# Patient Record
Sex: Male | Born: 1965
Health system: Southern US, Community
[De-identification: ages and names within clinical notes are randomized; demographics above are authoritative.]

## PROBLEM LIST (undated history)

## (undated) DIAGNOSIS — H4710 Unspecified papilledema: Secondary | ICD-10-CM

## (undated) DIAGNOSIS — H02409 Unspecified ptosis of unspecified eyelid: Secondary | ICD-10-CM

## (undated) DIAGNOSIS — H532 Diplopia: Secondary | ICD-10-CM

## (undated) DIAGNOSIS — E7800 Pure hypercholesterolemia, unspecified: Secondary | ICD-10-CM

## (undated) DIAGNOSIS — I10 Essential (primary) hypertension: Secondary | ICD-10-CM

## (undated) HISTORY — DX: Diplopia: H53.2

## (undated) HISTORY — DX: Essential (primary) hypertension: I10

## (undated) HISTORY — DX: Unspecified papilledema: H47.10

## (undated) HISTORY — DX: Unspecified ptosis of unspecified eyelid: H02.409

## (undated) HISTORY — DX: Pure hypercholesterolemia, unspecified: E78.00

---

## 2005-05-02 ENCOUNTER — Encounter: Admission: RE | Admit: 2005-05-02 | Discharge: 2005-05-02 | Payer: Self-pay | Admitting: Emergency Medicine

## 2009-01-16 ENCOUNTER — Encounter: Admission: RE | Admit: 2009-01-16 | Discharge: 2009-01-16 | Payer: Self-pay | Admitting: Family Medicine

## 2018-10-25 DIAGNOSIS — Z125 Encounter for screening for malignant neoplasm of prostate: Secondary | ICD-10-CM | POA: Diagnosis not present

## 2018-10-25 DIAGNOSIS — Z23 Encounter for immunization: Secondary | ICD-10-CM | POA: Diagnosis not present

## 2018-12-03 DIAGNOSIS — R112 Nausea with vomiting, unspecified: Secondary | ICD-10-CM | POA: Diagnosis not present

## 2018-12-03 DIAGNOSIS — A084 Viral intestinal infection, unspecified: Secondary | ICD-10-CM | POA: Diagnosis not present

## 2018-12-03 DIAGNOSIS — R51 Headache: Secondary | ICD-10-CM | POA: Diagnosis not present

## 2019-05-09 DIAGNOSIS — Z0000 Encounter for general adult medical examination without abnormal findings: Secondary | ICD-10-CM | POA: Diagnosis not present

## 2019-05-23 DIAGNOSIS — Z23 Encounter for immunization: Secondary | ICD-10-CM | POA: Diagnosis not present

## 2019-05-23 DIAGNOSIS — Z0000 Encounter for general adult medical examination without abnormal findings: Secondary | ICD-10-CM | POA: Diagnosis not present

## 2019-05-23 DIAGNOSIS — Z13220 Encounter for screening for lipoid disorders: Secondary | ICD-10-CM | POA: Diagnosis not present

## 2019-05-23 DIAGNOSIS — Z125 Encounter for screening for malignant neoplasm of prostate: Secondary | ICD-10-CM | POA: Diagnosis not present

## 2019-07-10 DIAGNOSIS — R42 Dizziness and giddiness: Secondary | ICD-10-CM | POA: Diagnosis not present

## 2019-07-22 DIAGNOSIS — H8110 Benign paroxysmal vertigo, unspecified ear: Secondary | ICD-10-CM | POA: Diagnosis not present

## 2019-07-22 DIAGNOSIS — H6121 Impacted cerumen, right ear: Secondary | ICD-10-CM | POA: Diagnosis not present

## 2019-07-22 DIAGNOSIS — Z79899 Other long term (current) drug therapy: Secondary | ICD-10-CM | POA: Diagnosis not present

## 2019-09-18 DIAGNOSIS — Z712 Person consulting for explanation of examination or test findings: Secondary | ICD-10-CM | POA: Diagnosis not present

## 2019-09-18 DIAGNOSIS — Z20828 Contact with and (suspected) exposure to other viral communicable diseases: Secondary | ICD-10-CM | POA: Diagnosis not present

## 2019-09-19 DIAGNOSIS — Z20828 Contact with and (suspected) exposure to other viral communicable diseases: Secondary | ICD-10-CM | POA: Diagnosis not present

## 2019-09-19 DIAGNOSIS — Z712 Person consulting for explanation of examination or test findings: Secondary | ICD-10-CM | POA: Diagnosis not present

## 2019-12-26 ENCOUNTER — Ambulatory Visit: Payer: Self-pay | Attending: Internal Medicine

## 2019-12-26 DIAGNOSIS — Z23 Encounter for immunization: Secondary | ICD-10-CM

## 2019-12-26 NOTE — Progress Notes (Signed)
   Covid-19 Vaccination Clinic  Name:  Keshone Yetter    MRN: [id number] DOB: [birth year]  12/26/2019  Mr. Jons was observed post Covid-19 immunization for 15 minutes without incident. He was provided with Vaccine Information Sheet and instruction to access the V-Safe system.   Mr. Arrant was instructed to call 911 with any severe reactions post vaccine: Marland Kitchen Difficulty breathing  . Swelling of face and throat  . A fast heartbeat  . A bad rash all over body  . Dizziness and weakness   Immunizations Administered    Name Date Dose VIS Date Route   Pfizer COVID-19 Vaccine 12/26/2019 12:03 PM 0.3 mL 09/20/2019 Intramuscular   Manufacturer: [place]   Lot: [id number]   [place]: [id number]

## 2020-01-20 ENCOUNTER — Ambulatory Visit: Payer: Self-pay | Attending: Internal Medicine

## 2020-01-20 DIAGNOSIS — Z23 Encounter for immunization: Secondary | ICD-10-CM

## 2020-01-20 NOTE — Progress Notes (Signed)
   Covid-19 Vaccination Clinic  Name:  Coleston Sarff    MRN: [id number] DOB: Mar 03, 1966  01/20/2020  Mr. Desilva was observed post Covid-19 immunization for 15 minutes without incident. He was provided with Vaccine Information Sheet and instruction to access the V-Safe system.   Mr. Goben was instructed to call 911 with any severe reactions post vaccine: Marland Kitchen Difficulty breathing  . Swelling of face and throat  . A fast heartbeat  . A bad rash all over body  . Dizziness and weakness   Immunizations Administered    Name Date Dose VIS Date Route   Pfizer COVID-19 Vaccine 01/20/2020 10:47 AM 0.3 mL 09/20/2019 Intramuscular   Manufacturer: [place]   Lot: [id number]   [place]: [id number]

## 2020-04-06 DIAGNOSIS — H4710 Unspecified papilledema: Secondary | ICD-10-CM | POA: Diagnosis not present

## 2020-04-14 ENCOUNTER — Encounter: Payer: Self-pay | Admitting: *Deleted

## 2020-04-15 ENCOUNTER — Ambulatory Visit
Admission: RE | Admit: 2020-04-15 | Discharge: 2020-04-15 | Disposition: A | Discharge status: Home / Self Care | Payer: BC Managed Care – PPO | Source: Ambulatory Visit | Attending: Diagnostic Neuroimaging | Admitting: Diagnostic Neuroimaging

## 2020-04-15 ENCOUNTER — Telehealth: Payer: Self-pay | Admitting: Diagnostic Neuroimaging

## 2020-04-15 ENCOUNTER — Ambulatory Visit (INDEPENDENT_AMBULATORY_CARE_PROVIDER_SITE_OTHER): Payer: BC Managed Care – PPO | Admitting: Diagnostic Neuroimaging

## 2020-04-15 ENCOUNTER — Encounter: Payer: Self-pay | Admitting: Diagnostic Neuroimaging

## 2020-04-15 VITALS — BP 146/88 | HR 85 | Ht 76.0 in | Wt 217.0 lb

## 2020-04-15 DIAGNOSIS — H4710 Unspecified papilledema: Secondary | ICD-10-CM

## 2020-04-15 DIAGNOSIS — H0520 Unspecified exophthalmos: Secondary | ICD-10-CM

## 2020-04-15 DIAGNOSIS — Q158 Other specified congenital malformations of eye: Secondary | ICD-10-CM

## 2020-04-15 DIAGNOSIS — D1809 Hemangioma of other sites: Secondary | ICD-10-CM

## 2020-04-15 DIAGNOSIS — H4711 Papilledema associated with increased intracranial pressure: Secondary | ICD-10-CM | POA: Diagnosis not present

## 2020-04-15 DIAGNOSIS — H532 Diplopia: Secondary | ICD-10-CM | POA: Diagnosis not present

## 2020-04-15 MED ORDER — GADOBENATE DIMEGLUMINE 529 MG/ML IV SOLN
20.0000 mL | Freq: Once | INTRAVENOUS | Status: AC | PRN
  Administered 2020-04-15: 20 mL via INTRAVENOUS

## 2020-04-15 NOTE — Progress Notes (Signed)
[facility]  PATIENT: Lucas Glenn DOB: 07-02-1966  REFERRING CLINICIAN: [place], Heather, OD HISTORY FROM: patient  REASON FOR VISIT: new consult    HISTORICAL  CHIEF COMPLAINT:  Chief Complaint  Patient presents with  . Proptosis, optic nerve edema, double vision    rm 7 New Pt "my eyes are differrent but they have been since my jaw surgery in 1988"    HISTORY OF PRESENT ILLNESS:   54 year old male here for evaluation of right proptosis, right papilledema, double vision.  In February 2021 patient's friend noticed that patient's right eye was starting to protrude outward.  No blurred vision or eye pain at that time.  Patient continues to stay active with running, bowling and golf.  His current bowling average is 238.  He has run more than 20 marathons.  He has also completed an Ironman triathlon.  Several weeks ago patient noticed redness and watering in his right eye.  He had some irritation.  Went to optometrist who noted right proptosis, and right optic nerve edema.  Patient was referred urgently to [place] for evaluation.  Patient has noticed some intermittent vertical double vision when driving in his car, especially late at night.  No problems with left eye.  No problems with speech or swallowing.  No problems with arms or legs.  No headaches.  Patient has history of oral surgery on his maxillary mandibular regions in 1988 to treat an underbite.   REVIEW OF SYSTEMS: Full 14 system review of systems performed and negative with exception of: As per HPI.  ALLERGIES: No Known Allergies  HOME MEDICATIONS: Outpatient Medications Prior to Visit  Medication Sig Dispense Refill  . atorvastatin (LIPITOR) 10 MG tablet Take 10 mg by mouth daily.    . Multiple Vitamin (MULTIVITAMIN WITH MINERALS) TABS tablet Take 1 tablet by mouth daily.    Marland Kitchen telmisartan (MICARDIS) 40 MG tablet Take by mouth.    [facility] ophthalmic solution Place 1 drop into the right eye 4 (four)  times daily.    Marland Kitchen UNABLE TO FIND Med Name: for joints     No facility-administered medications prior to visit.    PAST MEDICAL HISTORY: Past Medical History:  Diagnosis Date  . Double vision   . High cholesterol   . Hypertension   . Optic nerve edema   . Ptosis     PAST SURGICAL HISTORY: Past Surgical History:  Procedure Laterality Date  . COLONOSCOPY    . INGUINAL HERNIA REPAIR  1988  . INNER EAR SURGERY  1969   tubes  . MANDIBLE SURGERY  1988  . TONSILLECTOMY AND ADENOIDECTOMY     as child    FAMILY HISTORY: Family History  Problem Relation Age of Onset  . Other Mother        viral encephalitis    SOCIAL HISTORY: Social History   Socioeconomic History  . Marital status: Single    Spouse name: Not on file  . Number of children: 0  . Years of education: Not on file  . Highest education level: Master's degree (e.g., MA, MS, MEng, MEd, MSW, MBA)  Occupational History    Comment: [employer]  Tobacco Use  . Smoking status: Never Smoker  . Smokeless tobacco: Never Used  Substance and Sexual Activity  . Alcohol use: Yes    Comment: socially  . Drug use: Not Currently  . Sexual activity: Not on file  Other Topics Concern  . Not on file  Social History Narrative  Caffeine- tea ,Coke 2-3   Social Determinants of Health   Financial Resource Strain:   . Difficulty of Paying Living Expenses:   Food Insecurity:   . Worried About Charity fundraiser in the Last Year:   . Arboriculturist in the Last Year:   Transportation Needs:   . Film/video editor (Medical):   Marland Kitchen Lack of Transportation (Non-Medical):   Physical Activity:   . Days of Exercise per Week:   . Minutes of Exercise per Session:   Stress:   . Feeling of Stress :   Social Connections:   . Frequency of Communication with Friends and Family:   . Frequency of Social Gatherings with Friends and Family:   . Attends Religious Services:   . Active Member of Clubs or Organizations:   .  Attends Archivist Meetings:   Marland Kitchen Marital Status:   Intimate Partner Violence:   . Fear of Current or Ex-Partner:   . Emotionally Abused:   Marland Kitchen Physically Abused:   . Sexually Abused:      PHYSICAL EXAM  GENERAL EXAM/CONSTITUTIONAL: Vitals:  Vitals:   04/15/20 1243  BP: (!) 146/88  Pulse: 85  Weight: 217 lb (98.4 kg)  Height: 6\' 4"  (1.93 m)     Body mass index is 26.41 kg/m. Wt Readings from Last 3 Encounters:  04/15/20 217 lb (98.4 kg)     Patient is in no distress; well developed, nourished and groomed; neck is supple  CARDIOVASCULAR:  Examination of carotid arteries is normal; no carotid bruits  Regular rate and rhythm, no murmurs  Examination of peripheral vascular system by observation and palpation is normal  EYES:  RIGHT PROPTOSIS; Ophthalmoscopic exam of optic discs and posterior segments is NOTABLE FOR RIGHT OPTIC NERVE EDEMA No exam data present  MUSCULOSKELETAL:  Gait, strength, tone, movements noted in Neurologic exam below  NEUROLOGIC: MENTAL STATUS:  No flowsheet data found.  awake, alert, oriented to person, place and time  recent and remote memory intact  normal attention and concentration  language fluent, comprehension intact, naming intact  fund of knowledge appropriate  CRANIAL NERVE:   2nd - RIGHT OPTIC NERVE EDEMA  2nd, 3rd, 4th, 6th - pupils equal and reactive to light, visual fields full to confrontation, extraocular muscles intact, no nystagmus  5th - facial sensation symmetric  7th - facial strength symmetric  8th - hearing intact  9th - palate elevates symmetrically, uvula midline  11th - shoulder shrug symmetric  12th - tongue protrusion midline  MOTOR:   normal bulk and tone, full strength in the BUE, BLE  SENSORY:   normal and symmetric to light touch, pinprick, temperature, vibration  COORDINATION:   finger-nose-finger, fine finger movements normal  REFLEXES:   deep tendon reflexes  present and symmetric  GAIT/STATION:   narrow based gait; able to walk on toes, heels and tandem; romberg is negative     DIAGNOSTIC DATA (LABS, IMAGING, TESTING) - I reviewed patient records, labs, notes, testing and imaging myself where available.  No results found for: WBC, HGB, HCT, MCV, PLT No results found for: NA, K, CL, CO2, GLUCOSE, BUN, CREATININE, CALCIUM, PROT, ALBUMIN, AST, ALT, ALKPHOS, BILITOT, GFRNONAA, GFRAA No results found for: CHOL, HDL, LDLCALC, LDLDIRECT, TRIG, CHOLHDL No results found for: HGBA1C No results found for: VITAMINB12 No results found for: TSH     ASSESSMENT AND PLAN  54 y.o. year old male here with gradual onset proptosis, papilledema and double vision, since February  2021 or earlier.  Will check urgent MRI brain and orbits to rule out retro-orbital mass.  Dx:  1. Proptosis   2. Papilledema of right eye   3. Double vision with both eyes open       PLAN:  DOUBLE VISION / RIGHT PROPTOSIS / RIGHT PAPILLEDEMA - check MRI brain / orbits - check TSH, CBC, CMP  Orders Placed This Encounter  Procedures  . MR BRAIN W WO CONTRAST  . MR ORBITS W WO CONTRAST  . CBC with Differential/Platelet  . Comprehensive metabolic panel  . TSH   Return pending test results, for pending if symptoms worsen or fail to improve.  I reviewed images, labs, notes, records myself. I summarized findings and reviewed with patient, for this high risk condition (suspected right eye mass) requiring high complexity decision making.     Penni Bombard, MD 04/13/517, 3:35 PM Certified in Neurology, Neurophysiology and Neuroimaging  [facility] [address], [place], [place] [address] [phone]   ADDENDUM (04/15/20 7:09 PM) - MRI brain and orbits were ordered and obtained on the same day.  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  Results reviewed with patient  by phone.  Will refer patient to orbital surgeon / ophthalmology for definitive evaluation and treatment.  Penni Bombard, MD 10/10/8865, 7:37 PM Certified in Neurology, Neurophysiology and Neuroimaging  [facility] [address], [place], [place] [address] [phone]

## 2020-04-15 NOTE — Telephone Encounter (Signed)
BCBS Auth: [id number] (exp. 04/15/20 to 10/11/20) patient is scheduled at GI for 04/15/20

## 2020-04-15 NOTE — Telephone Encounter (Signed)
I called patient with test results.  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  Will setup referral asap.  Orders Placed This Encounter  Procedures  . Ambulatory referral to Ophthalmology    Penni Bombard, MD 01/10/8886, 5:79 PM Certified in Neurology, Neurophysiology and Neuroimaging  [facility] [address] [place], [place] [address] [phone]

## 2020-04-16 ENCOUNTER — Telehealth: Payer: Self-pay | Admitting: *Deleted

## 2020-04-16 LAB — COMPREHENSIVE METABOLIC PANEL
ALT: 24 IU/L (ref 0–44)
AST: 21 IU/L (ref 0–40)
Albumin/Globulin Ratio: 1.8 (ref 1.2–2.2)
Albumin: 4.9 g/dL (ref 3.8–4.9)
Alkaline Phosphatase: 87 IU/L (ref 48–121)
BUN/Creatinine Ratio: 25 — ABNORMAL HIGH (ref 9–20)
BUN: 23 mg/dL (ref 6–24)
Bilirubin Total: 0.5 mg/dL (ref 0.0–1.2)
CO2: 23 mmol/L (ref 20–29)
Calcium: 9.7 mg/dL (ref 8.7–10.2)
Chloride: 98 mmol/L (ref 96–106)
Creatinine, Ser: 0.93 mg/dL (ref 0.76–1.27)
GFR calc Af Amer: 106 mL/min/{1.73_m2} (ref >59)
GFR calc non Af Amer: 92 mL/min/{1.73_m2} (ref >59)
Globulin, Total: 2.7 g/dL (ref 1.5–4.5)
Glucose: 96 mg/dL (ref 65–99)
Potassium: 4.5 mmol/L (ref 3.5–5.2)
Sodium: 137 mmol/L (ref 134–144)
Total Protein: 7.6 g/dL (ref 6.0–8.5)

## 2020-04-16 LAB — CBC WITH DIFFERENTIAL/PLATELET
Basophils Absolute: 0.1 10*3/uL (ref 0.0–0.2)
Basos: 1 %
EOS (ABSOLUTE): 0.2 10*3/uL (ref 0.0–0.4)
Eos: 3 %
Hematocrit: 48.9 % (ref 37.5–51.0)
Hemoglobin: 16.3 g/dL (ref 13.0–17.7)
Immature Grans (Abs): 0 10*3/uL (ref 0.0–0.1)
Immature Granulocytes: 0 %
Lymphocytes Absolute: 2.3 10*3/uL (ref 0.7–3.1)
Lymphs: 29 %
MCH: 29.4 pg (ref 26.6–33.0)
MCHC: 33.3 g/dL (ref 31.5–35.7)
MCV: 88 fL (ref 79–97)
Monocytes Absolute: 0.9 10*3/uL (ref 0.1–0.9)
Monocytes: 11 %
Neutrophils Absolute: 4.5 10*3/uL (ref 1.4–7.0)
Neutrophils: 56 %
Platelets: 305 10*3/uL (ref 150–450)
RBC: 5.54 x10E6/uL (ref 4.14–5.80)
RDW: 12.6 % (ref 11.6–15.4)
WBC: 8 10*3/uL (ref 3.4–10.8)

## 2020-04-16 LAB — TSH: TSH: 2.24 u[IU]/mL (ref 0.450–4.500)

## 2020-04-16 NOTE — Telephone Encounter (Signed)
Spoke with patient and informed him his labs are okay. Patient verbalized understanding, appreciation.

## 2020-04-16 NOTE — Telephone Encounter (Addendum)
Received call from [facility] re: MRI brain, orbits who stated the MD wanted to be sure Dr Leta Baptist has seen reports. I advised her Dr Leta Baptist made a note last night re: right orbital mass, will refer to ophthalmology for urgent evaluation. Malachy Mood verbalized understanding, appreciation. Called patient and LVM informing his Hinton Dyer is working on his referral to [facility] and to be expecting a  call from them. Also advised he come to office to pick up a disk of MRI images before he goes to [facility].  Left #.

## 2020-04-16 NOTE — Telephone Encounter (Signed)
Patient spoke with referral coordinator then call was sent to me. He stated he will be flying out of town tomorrow but had discussed that with Dr Leta Baptist last night. He will get disk form [facility] and expect a call from [place]. He verbalized understanding, appreciation.

## 2020-04-23 DIAGNOSIS — H468 Other optic neuritis: Secondary | ICD-10-CM | POA: Diagnosis not present

## 2020-04-23 DIAGNOSIS — H0589 Other disorders of orbit: Secondary | ICD-10-CM | POA: Diagnosis not present

## 2020-05-14 DIAGNOSIS — Z125 Encounter for screening for malignant neoplasm of prostate: Secondary | ICD-10-CM | POA: Diagnosis not present

## 2020-05-14 DIAGNOSIS — E7800 Pure hypercholesterolemia, unspecified: Secondary | ICD-10-CM | POA: Diagnosis not present

## 2020-05-14 DIAGNOSIS — Z0000 Encounter for general adult medical examination without abnormal findings: Secondary | ICD-10-CM | POA: Diagnosis not present

## 2020-05-14 DIAGNOSIS — I10 Essential (primary) hypertension: Secondary | ICD-10-CM | POA: Diagnosis not present

## 2020-05-14 DIAGNOSIS — H6123 Impacted cerumen, bilateral: Secondary | ICD-10-CM | POA: Diagnosis not present

## 2020-05-14 DIAGNOSIS — Z23 Encounter for immunization: Secondary | ICD-10-CM | POA: Diagnosis not present

## 2020-06-30 DIAGNOSIS — Z23 Encounter for immunization: Secondary | ICD-10-CM | POA: Diagnosis not present

## 2020-07-03 DIAGNOSIS — D4989 Neoplasm of unspecified behavior of other specified sites: Secondary | ICD-10-CM | POA: Diagnosis not present

## 2020-07-03 DIAGNOSIS — E785 Hyperlipidemia, unspecified: Secondary | ICD-10-CM | POA: Diagnosis not present

## 2020-07-03 DIAGNOSIS — I10 Essential (primary) hypertension: Secondary | ICD-10-CM | POA: Diagnosis not present

## 2020-07-03 DIAGNOSIS — Z79899 Other long term (current) drug therapy: Secondary | ICD-10-CM | POA: Diagnosis not present

## 2020-07-03 DIAGNOSIS — D1809 Hemangioma of other sites: Secondary | ICD-10-CM | POA: Diagnosis not present

## 2020-10-07 DIAGNOSIS — J988 Other specified respiratory disorders: Secondary | ICD-10-CM | POA: Diagnosis not present

## 2020-10-07 DIAGNOSIS — R0981 Nasal congestion: Secondary | ICD-10-CM | POA: Diagnosis not present

## 2020-10-07 DIAGNOSIS — Z03818 Encounter for observation for suspected exposure to other biological agents ruled out: Secondary | ICD-10-CM | POA: Diagnosis not present

## 2020-11-27 DIAGNOSIS — I10 Essential (primary) hypertension: Secondary | ICD-10-CM | POA: Diagnosis not present

## 2020-12-09 DIAGNOSIS — D3161 Benign neoplasm of unspecified site of right orbit: Secondary | ICD-10-CM | POA: Diagnosis not present

## 2021-06-02 DIAGNOSIS — Z0000 Encounter for general adult medical examination without abnormal findings: Secondary | ICD-10-CM | POA: Diagnosis not present

## 2021-06-02 DIAGNOSIS — Z125 Encounter for screening for malignant neoplasm of prostate: Secondary | ICD-10-CM | POA: Diagnosis not present

## 2021-06-02 DIAGNOSIS — Z131 Encounter for screening for diabetes mellitus: Secondary | ICD-10-CM | POA: Diagnosis not present

## 2021-06-02 DIAGNOSIS — I10 Essential (primary) hypertension: Secondary | ICD-10-CM | POA: Diagnosis not present

## 2021-06-02 DIAGNOSIS — E7800 Pure hypercholesterolemia, unspecified: Secondary | ICD-10-CM | POA: Diagnosis not present

## 2021-08-24 IMAGING — MR MR HEAD WO/W CM
13 of 16 series · 35 of 48 positions shown · IV contrast (multihance)
Comparison: No pertinent prior studies available for comparison.

CLINICAL DATA: Proptosis. Papilledema of right eye. Double vision
with both eyes open. Additional history provided by scanning
technologist: Patient reports right eye bulging forward for many
years, recently visited new eye doctor

EXAM:
MRI HEAD AND ORBITS WITHOUT AND WITH CONTRAST
TECHNIQUE: Multiplanar, multiecho pulse sequences of the brain and surrounding
structures were obtained without and with intravenous contrast.
Multiplanar, multiecho pulse sequences of the orbits and surrounding
structures were obtained including fat saturation techniques, before
and after intravenous contrast administration.
CONTRAST:  20mL MULTIHANCE GADOBENATE DIMEGLUMINE 529 MG/ML IV SOLN

[Series 2: t1_se_sag · sagittal · 5.0mm · 0.45mm/px · 1 of 21 slices shown]
[im 1/21]
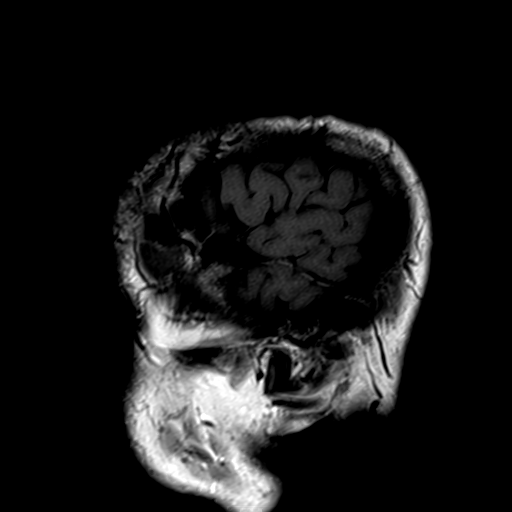

[Series 3: t2_tse_tra_512 · axial · 5.0mm · 0.62mm/px · 1 of 26 slices shown]
[im 1/26]
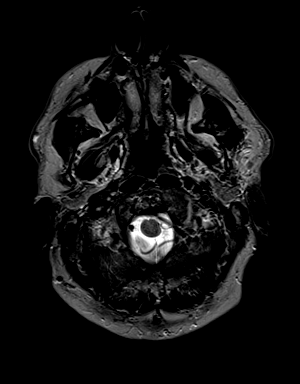

[Series 4: ep2d_diff_3 · axial · 3.0mm · 1.88mm/px · z∈[-56,+84]mm · 6 of 96 slices shown]
[im 1/96]
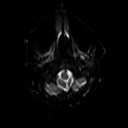
[im 20/96]
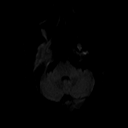
[im 39/96]
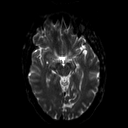
[im 58/96]
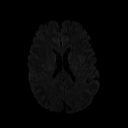
[im 77/96]
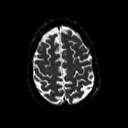
[im 96/96]
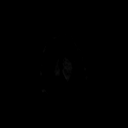

[Series 5: ep2d_diff_3_adc · axial · 3.0mm · 1.88mm/px · z∈[-56,+84]mm · 3 of 47 slices shown]
[im 1/47]
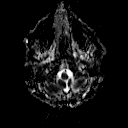
[im 24/47]
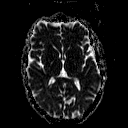
[im 47/47]
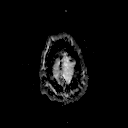

[Series 7: swi_images · axial · 2.0mm · 0.94mm/px · z∈[-57,+85]mm · 4 of 72 slices shown]
[im 1/72]
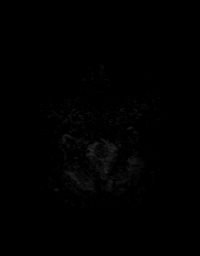
[im 24/72]
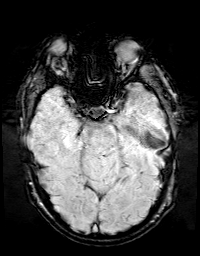
[im 48/72]
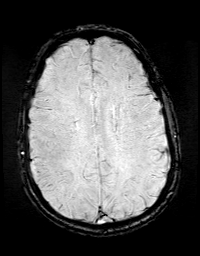
[im 72/72]
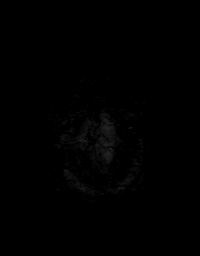

[Series 8: FLAIR · axial · 3.0mm · 0.47mm/px · z∈[-53,+81]mm · 2 of 30 slices shown]
[im 1/30]
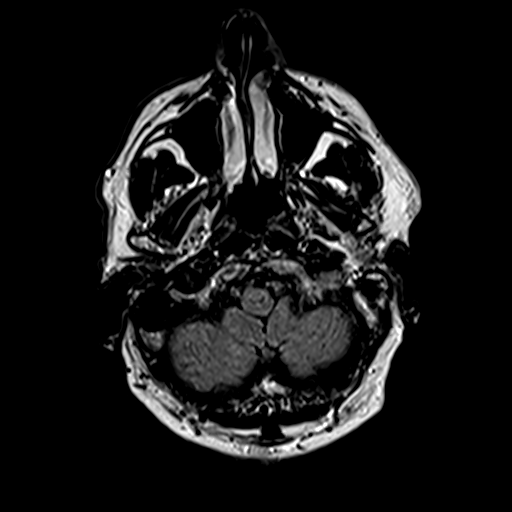
[im 30/30]
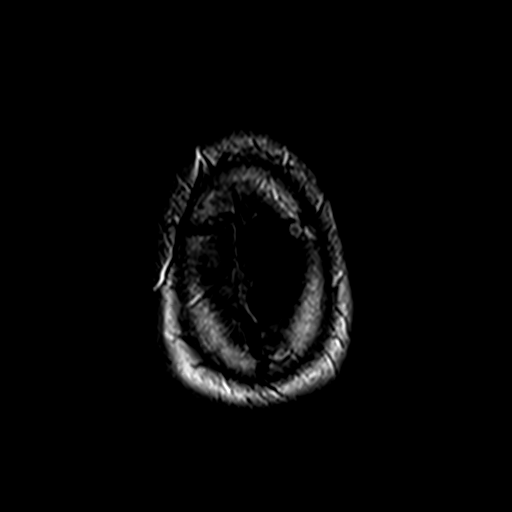

[Series 9: t1_mpr_tra · axial · 1.0mm · 0.75mm/px · z∈[-57,+86]mm · 8 of 144 slices shown]
[im 1/144]
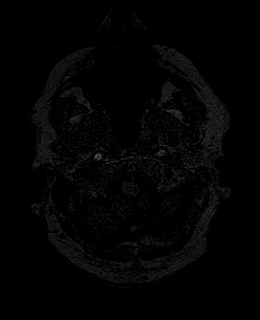
[im 18/144]
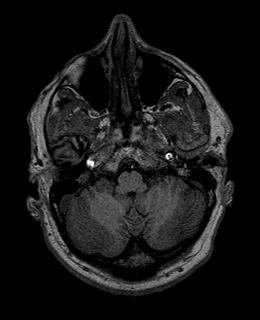
[im 36/144]
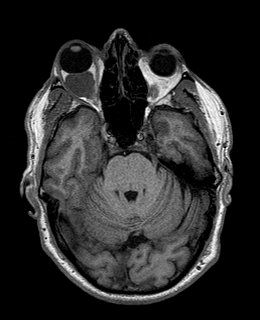
[im 54/144]
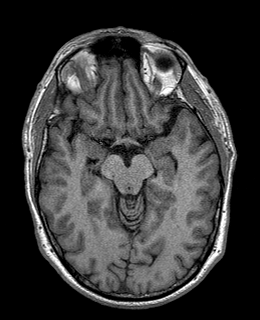
[im 90/144]
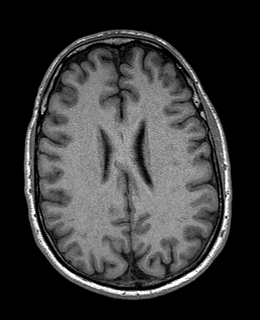
[im 108/144]
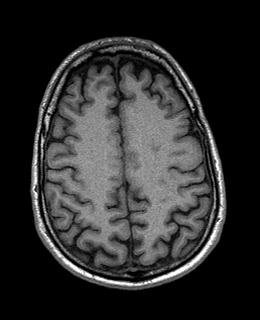
[im 126/144]
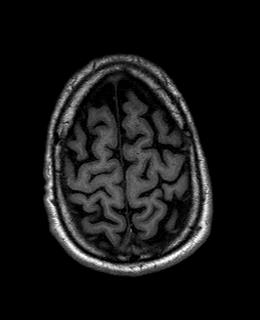
[im 144/144]
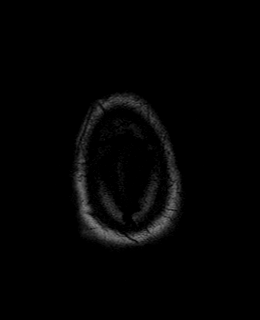

[Series 10: t1_tse cor_3mm · coronal · 3.0mm · 0.35mm/px · 2 of 30 slices shown]
[im 1/30]
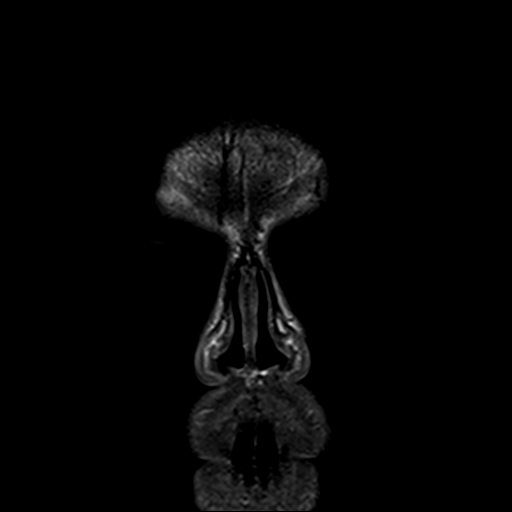
[im 30/30]
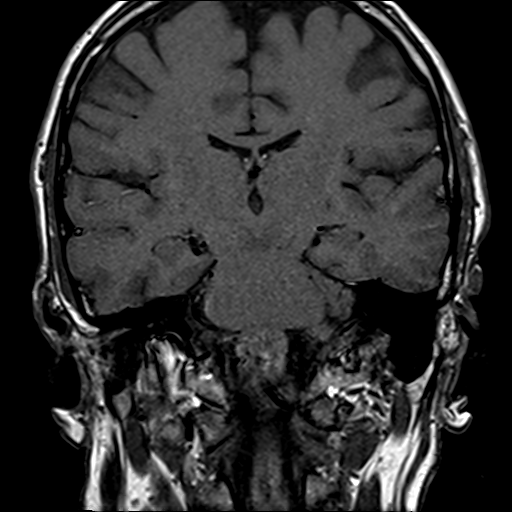

[Series 11: t2_cor_fs_3mm · coronal · 3.0mm · 0.35mm/px · 2 of 30 slices shown]
[im 1/30]
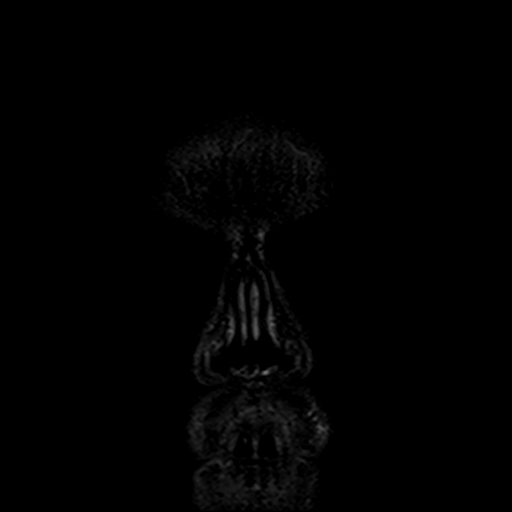
[im 30/30]
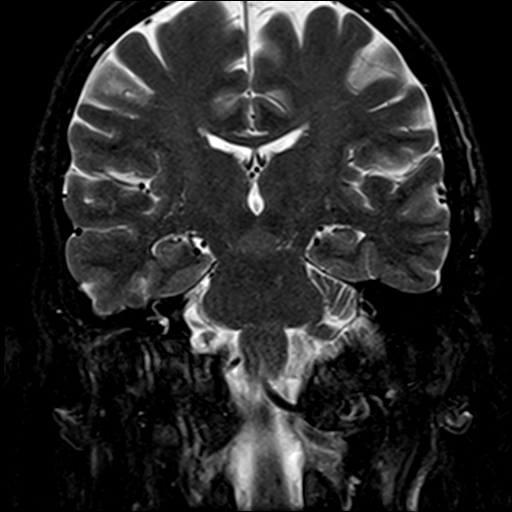

[Series 12: t1_tse axial_3mm · axial · 3.0mm · 0.35mm/px · 1 of 20 slices shown]
[im 1/20]
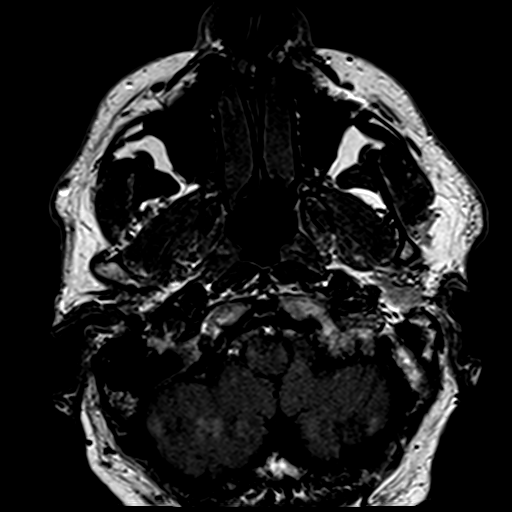

[Series 13: t2_ax_fs_3mm · axial · 3.0mm · 0.35mm/px · 1 of 20 slices shown]
[im 1/20]
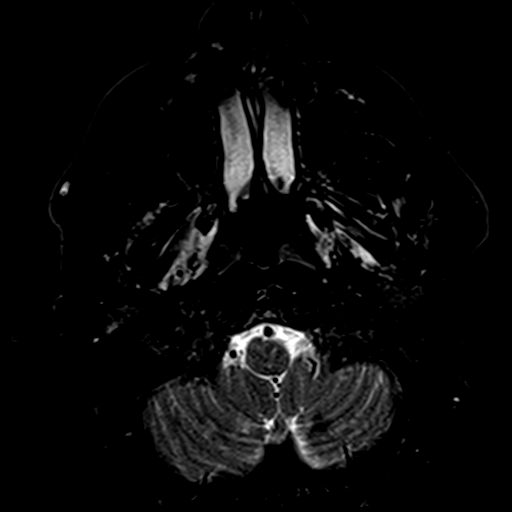

[Series 14: T2 · coronal · 5.0mm · 0.45mm/px · 2 of 26 slices shown]
[im 1/26]
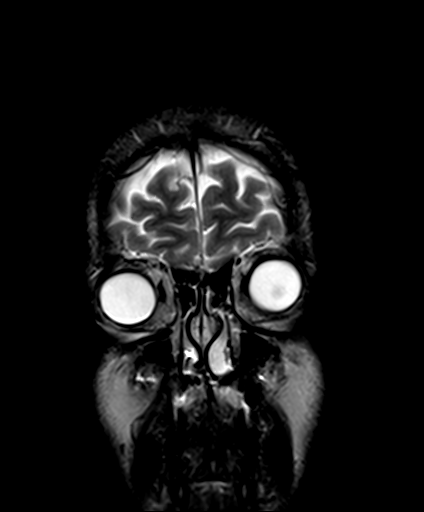
[im 26/26]
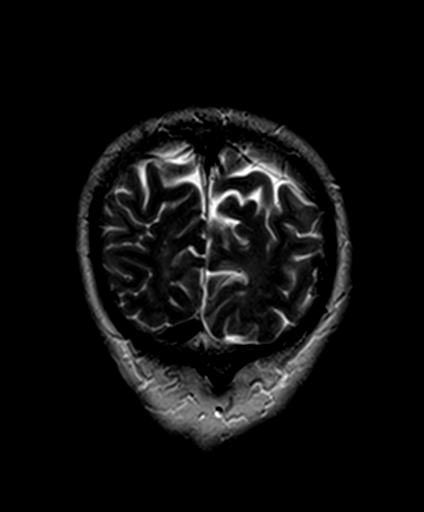

[Series 18: T1 post-contrast · coronal · 5.0mm · 0.45mm/px · 2 of 26 slices shown]
[im 1/26]
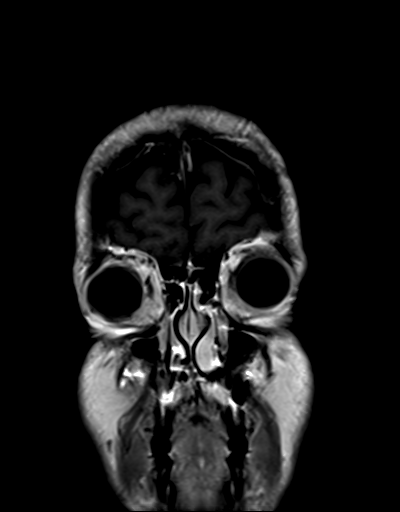
[im 26/26]
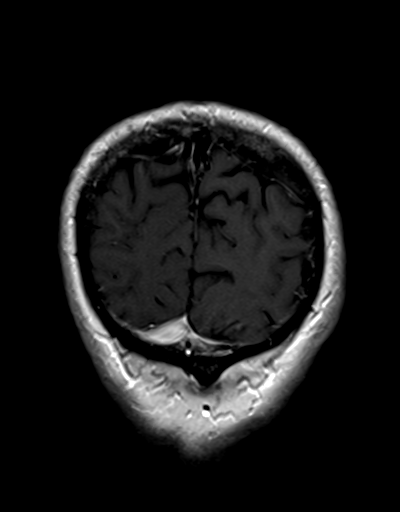

[35 of 48 positions shown; findings below may reference images not displayed]

FINDINGS: MRI HEAD FINDINGS

Brain:

Cerebral volume is normal for age.

No significant white matter disease for age.

There is no acute infarct.

No evidence of intracranial mass.

No chronic intracranial blood products.

No extra-axial fluid collection.

No midline shift.

No abnormal enhancement is demonstrated within the intracranial
compartment.

Vascular: Expected proximal arterial flow voids.

Skull and upper cervical spine: No focal marrow lesion.

MRI ORBITS FINDINGS

Orbits: Within the intraconal and extraconal right orbit, there is a
homogeneous mildly T2 hyperintense lobular mass which measures 2.6 x
2.1 x 2.3 cm (AP x TV x CC). There is associated chemical shift
artifact on the axial T2 weighted sequences (series 3, image 8). The
mass demonstrates homogeneous and avid postcontrast enhancement.
This constellation of imaging features is most consistent with
orbital cavernous venous malformation (hemangioma). There is mass
effect upon the intraorbital right optic nerve with superior
displacement of the nerve. Additionally, there is mass effect upon
the adjacent extraocular muscles. There is also mass effect upon the
posterior aspect of the right globe with moderate right proptosis.
The left globe and orbit are unremarkable.

Visualized sinuses: Mild ethmoid sinus mucosal thickening.

Soft tissues: The visualized maxillofacial soft tissues are
unremarkable.

The MRI orbits impression will be called to the ordering clinician
or representative by the Radiologist Assistant, and communication
documented in the PACS or [REDACTED].
IMPRESSION: MRI brain:

Unremarkable MRI appearance of the brain for age. No evidence of
acute abnormality within the intracranial compartment.

MRI orbits:

2.6 x 2.1 x 2.3 cm mass within the central and inferior intraconal
and extraconal right orbit as described. Imaging features are most
consistent with orbital cavernous venous malformation (hemangioma).
Mass effect upon the intraorbital right optic nerve with superior
displacement of the nerve. Mass effect upon the posterior aspect of
the right globe with moderate right proptosis. Additionally, there
is mass effect upon the adjacent extraocular muscles.

## 2021-08-25 DIAGNOSIS — J029 Acute pharyngitis, unspecified: Secondary | ICD-10-CM | POA: Diagnosis not present

## 2021-08-25 DIAGNOSIS — R0982 Postnasal drip: Secondary | ICD-10-CM | POA: Diagnosis not present

## 2021-10-21 DIAGNOSIS — D3161 Benign neoplasm of unspecified site of right orbit: Secondary | ICD-10-CM | POA: Diagnosis not present

## 2022-06-17 DIAGNOSIS — Z0000 Encounter for general adult medical examination without abnormal findings: Secondary | ICD-10-CM | POA: Diagnosis not present

## 2022-06-17 DIAGNOSIS — Z131 Encounter for screening for diabetes mellitus: Secondary | ICD-10-CM | POA: Diagnosis not present

## 2022-06-17 DIAGNOSIS — Z125 Encounter for screening for malignant neoplasm of prostate: Secondary | ICD-10-CM | POA: Diagnosis not present

## 2022-06-17 DIAGNOSIS — I10 Essential (primary) hypertension: Secondary | ICD-10-CM | POA: Diagnosis not present

## 2022-06-17 DIAGNOSIS — E7800 Pure hypercholesterolemia, unspecified: Secondary | ICD-10-CM | POA: Diagnosis not present

## 2022-06-17 DIAGNOSIS — Z23 Encounter for immunization: Secondary | ICD-10-CM | POA: Diagnosis not present

## 2022-10-24 DIAGNOSIS — D3161 Benign neoplasm of unspecified site of right orbit: Secondary | ICD-10-CM | POA: Diagnosis not present

## 2022-12-19 DIAGNOSIS — R972 Elevated prostate specific antigen [PSA]: Secondary | ICD-10-CM | POA: Diagnosis not present

## 2023-05-22 DIAGNOSIS — U071 COVID-19: Secondary | ICD-10-CM | POA: Diagnosis not present

## 2023-07-11 DIAGNOSIS — R972 Elevated prostate specific antigen [PSA]: Secondary | ICD-10-CM | POA: Diagnosis not present

## 2023-07-11 DIAGNOSIS — I10 Essential (primary) hypertension: Secondary | ICD-10-CM | POA: Diagnosis not present

## 2023-07-11 DIAGNOSIS — Z23 Encounter for immunization: Secondary | ICD-10-CM | POA: Diagnosis not present

## 2023-07-11 DIAGNOSIS — Z0000 Encounter for general adult medical examination without abnormal findings: Secondary | ICD-10-CM | POA: Diagnosis not present

## 2023-07-11 DIAGNOSIS — E7800 Pure hypercholesterolemia, unspecified: Secondary | ICD-10-CM | POA: Diagnosis not present

## 2023-07-11 DIAGNOSIS — Z131 Encounter for screening for diabetes mellitus: Secondary | ICD-10-CM | POA: Diagnosis not present

## 2023-07-11 DIAGNOSIS — H6123 Impacted cerumen, bilateral: Secondary | ICD-10-CM | POA: Diagnosis not present

## 2023-08-15 DIAGNOSIS — J019 Acute sinusitis, unspecified: Secondary | ICD-10-CM | POA: Diagnosis not present

## 2023-10-25 DIAGNOSIS — D3161 Benign neoplasm of unspecified site of right orbit: Secondary | ICD-10-CM | POA: Diagnosis not present

## 2024-02-01 DIAGNOSIS — R972 Elevated prostate specific antigen [PSA]: Secondary | ICD-10-CM | POA: Diagnosis not present

## 2024-07-16 DIAGNOSIS — R5383 Other fatigue: Secondary | ICD-10-CM | POA: Diagnosis not present

## 2024-07-16 DIAGNOSIS — J069 Acute upper respiratory infection, unspecified: Secondary | ICD-10-CM | POA: Diagnosis not present
